# Patient Record
Sex: Female | Born: 1945 | Race: White | Hispanic: No | Marital: Married
Health system: Southern US, Community
[De-identification: ages and names within clinical notes are randomized; demographics above are authoritative.]

---

## 2016-05-18 DIAGNOSIS — F411 Generalized anxiety disorder: Secondary | ICD-10-CM | POA: Diagnosis not present

## 2016-05-18 DIAGNOSIS — F331 Major depressive disorder, recurrent, moderate: Secondary | ICD-10-CM | POA: Diagnosis not present

## 2016-05-26 DIAGNOSIS — F411 Generalized anxiety disorder: Secondary | ICD-10-CM | POA: Diagnosis not present

## 2016-05-26 DIAGNOSIS — F331 Major depressive disorder, recurrent, moderate: Secondary | ICD-10-CM | POA: Diagnosis not present

## 2016-06-30 DIAGNOSIS — F331 Major depressive disorder, recurrent, moderate: Secondary | ICD-10-CM | POA: Diagnosis not present

## 2016-06-30 DIAGNOSIS — F411 Generalized anxiety disorder: Secondary | ICD-10-CM | POA: Diagnosis not present

## 2016-07-03 DIAGNOSIS — R42 Dizziness and giddiness: Secondary | ICD-10-CM | POA: Diagnosis not present

## 2016-07-03 DIAGNOSIS — H8111 Benign paroxysmal vertigo, right ear: Secondary | ICD-10-CM | POA: Diagnosis not present

## 2016-07-06 DIAGNOSIS — R42 Dizziness and giddiness: Secondary | ICD-10-CM | POA: Diagnosis not present

## 2016-07-06 DIAGNOSIS — H8111 Benign paroxysmal vertigo, right ear: Secondary | ICD-10-CM | POA: Diagnosis not present

## 2016-07-07 DIAGNOSIS — F331 Major depressive disorder, recurrent, moderate: Secondary | ICD-10-CM | POA: Diagnosis not present

## 2016-07-07 DIAGNOSIS — F411 Generalized anxiety disorder: Secondary | ICD-10-CM | POA: Diagnosis not present

## 2016-07-07 DIAGNOSIS — H8111 Benign paroxysmal vertigo, right ear: Secondary | ICD-10-CM | POA: Diagnosis not present

## 2016-07-07 DIAGNOSIS — R42 Dizziness and giddiness: Secondary | ICD-10-CM | POA: Diagnosis not present

## 2016-07-09 DIAGNOSIS — H8111 Benign paroxysmal vertigo, right ear: Secondary | ICD-10-CM | POA: Diagnosis not present

## 2016-07-09 DIAGNOSIS — R42 Dizziness and giddiness: Secondary | ICD-10-CM | POA: Diagnosis not present

## 2016-07-09 DIAGNOSIS — F411 Generalized anxiety disorder: Secondary | ICD-10-CM | POA: Diagnosis not present

## 2016-07-09 DIAGNOSIS — F4323 Adjustment disorder with mixed anxiety and depressed mood: Secondary | ICD-10-CM | POA: Diagnosis not present

## 2016-07-14 DIAGNOSIS — H8111 Benign paroxysmal vertigo, right ear: Secondary | ICD-10-CM | POA: Diagnosis not present

## 2016-07-14 DIAGNOSIS — R42 Dizziness and giddiness: Secondary | ICD-10-CM | POA: Diagnosis not present

## 2016-07-17 DIAGNOSIS — H8111 Benign paroxysmal vertigo, right ear: Secondary | ICD-10-CM | POA: Diagnosis not present

## 2016-07-17 DIAGNOSIS — R42 Dizziness and giddiness: Secondary | ICD-10-CM | POA: Diagnosis not present

## 2016-07-20 DIAGNOSIS — H8111 Benign paroxysmal vertigo, right ear: Secondary | ICD-10-CM | POA: Diagnosis not present

## 2016-07-20 DIAGNOSIS — F331 Major depressive disorder, recurrent, moderate: Secondary | ICD-10-CM | POA: Diagnosis not present

## 2016-07-20 DIAGNOSIS — F411 Generalized anxiety disorder: Secondary | ICD-10-CM | POA: Diagnosis not present

## 2016-07-20 DIAGNOSIS — R42 Dizziness and giddiness: Secondary | ICD-10-CM | POA: Diagnosis not present

## 2016-07-22 DIAGNOSIS — H8111 Benign paroxysmal vertigo, right ear: Secondary | ICD-10-CM | POA: Diagnosis not present

## 2016-07-22 DIAGNOSIS — R42 Dizziness and giddiness: Secondary | ICD-10-CM | POA: Diagnosis not present

## 2016-07-30 ENCOUNTER — Ambulatory Visit: Payer: Medicare Other | Admitting: Physical Therapy

## 2016-08-19 ENCOUNTER — Ambulatory Visit: Payer: Self-pay | Admitting: Family Medicine

## 2016-08-19 DIAGNOSIS — F331 Major depressive disorder, recurrent, moderate: Secondary | ICD-10-CM | POA: Diagnosis not present

## 2016-08-19 DIAGNOSIS — F411 Generalized anxiety disorder: Secondary | ICD-10-CM | POA: Diagnosis not present

## 2016-09-17 DIAGNOSIS — F331 Major depressive disorder, recurrent, moderate: Secondary | ICD-10-CM | POA: Diagnosis not present

## 2016-09-17 DIAGNOSIS — F4323 Adjustment disorder with mixed anxiety and depressed mood: Secondary | ICD-10-CM | POA: Diagnosis not present

## 2016-09-17 DIAGNOSIS — F411 Generalized anxiety disorder: Secondary | ICD-10-CM | POA: Diagnosis not present

## 2016-09-17 DIAGNOSIS — F5105 Insomnia due to other mental disorder: Secondary | ICD-10-CM | POA: Diagnosis not present

## 2016-09-23 ENCOUNTER — Ambulatory Visit: Payer: Medicare Other | Admitting: Family Medicine

## 2016-10-20 DIAGNOSIS — F411 Generalized anxiety disorder: Secondary | ICD-10-CM | POA: Diagnosis not present

## 2016-10-20 DIAGNOSIS — F331 Major depressive disorder, recurrent, moderate: Secondary | ICD-10-CM | POA: Diagnosis not present

## 2016-10-27 DIAGNOSIS — F411 Generalized anxiety disorder: Secondary | ICD-10-CM | POA: Diagnosis not present

## 2016-10-27 DIAGNOSIS — F331 Major depressive disorder, recurrent, moderate: Secondary | ICD-10-CM | POA: Diagnosis not present

## 2016-11-09 DIAGNOSIS — R42 Dizziness and giddiness: Secondary | ICD-10-CM | POA: Diagnosis not present

## 2016-11-09 DIAGNOSIS — H8112 Benign paroxysmal vertigo, left ear: Secondary | ICD-10-CM | POA: Diagnosis not present

## 2016-11-10 DIAGNOSIS — F331 Major depressive disorder, recurrent, moderate: Secondary | ICD-10-CM | POA: Diagnosis not present

## 2016-11-10 DIAGNOSIS — F411 Generalized anxiety disorder: Secondary | ICD-10-CM | POA: Diagnosis not present

## 2016-11-13 DIAGNOSIS — H8112 Benign paroxysmal vertigo, left ear: Secondary | ICD-10-CM | POA: Diagnosis not present

## 2016-11-13 DIAGNOSIS — R42 Dizziness and giddiness: Secondary | ICD-10-CM | POA: Diagnosis not present

## 2016-12-10 DIAGNOSIS — F411 Generalized anxiety disorder: Secondary | ICD-10-CM | POA: Diagnosis not present

## 2016-12-10 DIAGNOSIS — F4323 Adjustment disorder with mixed anxiety and depressed mood: Secondary | ICD-10-CM | POA: Diagnosis not present

## 2016-12-11 DIAGNOSIS — F411 Generalized anxiety disorder: Secondary | ICD-10-CM | POA: Diagnosis not present

## 2016-12-11 DIAGNOSIS — H6983 Other specified disorders of Eustachian tube, bilateral: Secondary | ICD-10-CM | POA: Diagnosis not present

## 2016-12-11 DIAGNOSIS — H903 Sensorineural hearing loss, bilateral: Secondary | ICD-10-CM | POA: Diagnosis not present

## 2016-12-11 DIAGNOSIS — F331 Major depressive disorder, recurrent, moderate: Secondary | ICD-10-CM | POA: Diagnosis not present

## 2016-12-11 DIAGNOSIS — H6123 Impacted cerumen, bilateral: Secondary | ICD-10-CM | POA: Diagnosis not present

## 2016-12-11 DIAGNOSIS — J342 Deviated nasal septum: Secondary | ICD-10-CM | POA: Diagnosis not present

## 2017-01-04 DIAGNOSIS — F411 Generalized anxiety disorder: Secondary | ICD-10-CM | POA: Diagnosis not present

## 2017-01-04 DIAGNOSIS — F331 Major depressive disorder, recurrent, moderate: Secondary | ICD-10-CM | POA: Diagnosis not present

## 2017-01-12 DIAGNOSIS — H8111 Benign paroxysmal vertigo, right ear: Secondary | ICD-10-CM | POA: Diagnosis not present

## 2017-01-12 DIAGNOSIS — F331 Major depressive disorder, recurrent, moderate: Secondary | ICD-10-CM | POA: Diagnosis not present

## 2017-01-12 DIAGNOSIS — R42 Dizziness and giddiness: Secondary | ICD-10-CM | POA: Diagnosis not present

## 2017-01-12 DIAGNOSIS — F411 Generalized anxiety disorder: Secondary | ICD-10-CM | POA: Diagnosis not present

## 2017-01-14 DIAGNOSIS — H8111 Benign paroxysmal vertigo, right ear: Secondary | ICD-10-CM | POA: Diagnosis not present

## 2017-01-14 DIAGNOSIS — F419 Anxiety disorder, unspecified: Secondary | ICD-10-CM | POA: Diagnosis not present

## 2017-01-19 DIAGNOSIS — F411 Generalized anxiety disorder: Secondary | ICD-10-CM | POA: Diagnosis not present

## 2017-01-19 DIAGNOSIS — F331 Major depressive disorder, recurrent, moderate: Secondary | ICD-10-CM | POA: Diagnosis not present

## 2017-01-20 DIAGNOSIS — H8111 Benign paroxysmal vertigo, right ear: Secondary | ICD-10-CM | POA: Diagnosis not present

## 2017-01-20 DIAGNOSIS — J329 Chronic sinusitis, unspecified: Secondary | ICD-10-CM | POA: Diagnosis not present

## 2017-02-16 DIAGNOSIS — F331 Major depressive disorder, recurrent, moderate: Secondary | ICD-10-CM | POA: Diagnosis not present

## 2017-02-16 DIAGNOSIS — F411 Generalized anxiety disorder: Secondary | ICD-10-CM | POA: Diagnosis not present

## 2017-02-18 DIAGNOSIS — Z1231 Encounter for screening mammogram for malignant neoplasm of breast: Secondary | ICD-10-CM | POA: Diagnosis not present

## 2017-02-18 DIAGNOSIS — E785 Hyperlipidemia, unspecified: Secondary | ICD-10-CM | POA: Diagnosis not present

## 2017-02-18 DIAGNOSIS — E559 Vitamin D deficiency, unspecified: Secondary | ICD-10-CM | POA: Diagnosis not present

## 2017-02-18 DIAGNOSIS — Z0000 Encounter for general adult medical examination without abnormal findings: Secondary | ICD-10-CM | POA: Diagnosis not present

## 2017-02-18 DIAGNOSIS — Z23 Encounter for immunization: Secondary | ICD-10-CM | POA: Diagnosis not present

## 2017-02-22 DIAGNOSIS — F411 Generalized anxiety disorder: Secondary | ICD-10-CM | POA: Diagnosis not present

## 2017-02-22 DIAGNOSIS — F331 Major depressive disorder, recurrent, moderate: Secondary | ICD-10-CM | POA: Diagnosis not present

## 2017-02-25 DIAGNOSIS — Z0000 Encounter for general adult medical examination without abnormal findings: Secondary | ICD-10-CM | POA: Diagnosis not present

## 2017-02-25 DIAGNOSIS — R350 Frequency of micturition: Secondary | ICD-10-CM | POA: Diagnosis not present

## 2017-02-25 DIAGNOSIS — E559 Vitamin D deficiency, unspecified: Secondary | ICD-10-CM | POA: Diagnosis not present

## 2017-02-25 DIAGNOSIS — E785 Hyperlipidemia, unspecified: Secondary | ICD-10-CM | POA: Diagnosis not present

## 2017-03-24 DIAGNOSIS — F331 Major depressive disorder, recurrent, moderate: Secondary | ICD-10-CM | POA: Diagnosis not present

## 2017-03-24 DIAGNOSIS — F411 Generalized anxiety disorder: Secondary | ICD-10-CM | POA: Diagnosis not present

## 2017-04-26 DIAGNOSIS — F331 Major depressive disorder, recurrent, moderate: Secondary | ICD-10-CM | POA: Diagnosis not present

## 2017-04-26 DIAGNOSIS — F411 Generalized anxiety disorder: Secondary | ICD-10-CM | POA: Diagnosis not present

## 2017-05-13 DIAGNOSIS — T300 Burn of unspecified body region, unspecified degree: Secondary | ICD-10-CM | POA: Diagnosis not present

## 2017-05-13 DIAGNOSIS — J309 Allergic rhinitis, unspecified: Secondary | ICD-10-CM | POA: Diagnosis not present

## 2017-05-13 DIAGNOSIS — L0390 Cellulitis, unspecified: Secondary | ICD-10-CM | POA: Diagnosis not present

## 2017-05-14 DIAGNOSIS — M810 Age-related osteoporosis without current pathological fracture: Secondary | ICD-10-CM | POA: Diagnosis not present

## 2017-05-14 DIAGNOSIS — F411 Generalized anxiety disorder: Secondary | ICD-10-CM | POA: Diagnosis not present

## 2017-05-14 DIAGNOSIS — F331 Major depressive disorder, recurrent, moderate: Secondary | ICD-10-CM | POA: Diagnosis not present

## 2017-05-14 DIAGNOSIS — M818 Other osteoporosis without current pathological fracture: Secondary | ICD-10-CM | POA: Diagnosis not present

## 2017-05-14 DIAGNOSIS — M85852 Other specified disorders of bone density and structure, left thigh: Secondary | ICD-10-CM | POA: Diagnosis not present

## 2017-05-21 DIAGNOSIS — F331 Major depressive disorder, recurrent, moderate: Secondary | ICD-10-CM | POA: Diagnosis not present

## 2017-05-21 DIAGNOSIS — F411 Generalized anxiety disorder: Secondary | ICD-10-CM | POA: Diagnosis not present

## 2017-06-23 DIAGNOSIS — J300 Vasomotor rhinitis: Secondary | ICD-10-CM | POA: Diagnosis not present

## 2017-06-23 DIAGNOSIS — Z01419 Encounter for gynecological examination (general) (routine) without abnormal findings: Secondary | ICD-10-CM | POA: Diagnosis not present

## 2017-06-23 DIAGNOSIS — J301 Allergic rhinitis due to pollen: Secondary | ICD-10-CM | POA: Diagnosis not present

## 2017-06-24 DIAGNOSIS — Z1231 Encounter for screening mammogram for malignant neoplasm of breast: Secondary | ICD-10-CM | POA: Diagnosis not present

## 2017-06-25 DIAGNOSIS — F331 Major depressive disorder, recurrent, moderate: Secondary | ICD-10-CM | POA: Diagnosis not present

## 2017-06-25 DIAGNOSIS — F411 Generalized anxiety disorder: Secondary | ICD-10-CM | POA: Diagnosis not present

## 2017-07-01 DIAGNOSIS — J300 Vasomotor rhinitis: Secondary | ICD-10-CM | POA: Diagnosis not present

## 2017-07-02 DIAGNOSIS — F411 Generalized anxiety disorder: Secondary | ICD-10-CM | POA: Diagnosis not present

## 2017-07-02 DIAGNOSIS — F331 Major depressive disorder, recurrent, moderate: Secondary | ICD-10-CM | POA: Diagnosis not present

## 2017-07-23 DIAGNOSIS — F411 Generalized anxiety disorder: Secondary | ICD-10-CM | POA: Diagnosis not present

## 2017-07-23 DIAGNOSIS — F331 Major depressive disorder, recurrent, moderate: Secondary | ICD-10-CM | POA: Diagnosis not present

## 2017-07-26 DIAGNOSIS — Z87891 Personal history of nicotine dependence: Secondary | ICD-10-CM | POA: Diagnosis not present

## 2017-07-26 DIAGNOSIS — J322 Chronic ethmoidal sinusitis: Secondary | ICD-10-CM | POA: Diagnosis not present

## 2017-07-26 DIAGNOSIS — F418 Other specified anxiety disorders: Secondary | ICD-10-CM | POA: Diagnosis not present

## 2017-07-26 DIAGNOSIS — H6983 Other specified disorders of Eustachian tube, bilateral: Secondary | ICD-10-CM | POA: Diagnosis not present

## 2017-07-30 DIAGNOSIS — F411 Generalized anxiety disorder: Secondary | ICD-10-CM | POA: Diagnosis not present

## 2017-07-30 DIAGNOSIS — F331 Major depressive disorder, recurrent, moderate: Secondary | ICD-10-CM | POA: Diagnosis not present

## 2017-08-04 DIAGNOSIS — F411 Generalized anxiety disorder: Secondary | ICD-10-CM | POA: Diagnosis not present

## 2017-08-04 DIAGNOSIS — F331 Major depressive disorder, recurrent, moderate: Secondary | ICD-10-CM | POA: Diagnosis not present

## 2017-08-27 DIAGNOSIS — J45909 Unspecified asthma, uncomplicated: Secondary | ICD-10-CM | POA: Diagnosis not present

## 2017-08-30 DIAGNOSIS — F411 Generalized anxiety disorder: Secondary | ICD-10-CM | POA: Diagnosis not present

## 2017-08-30 DIAGNOSIS — F331 Major depressive disorder, recurrent, moderate: Secondary | ICD-10-CM | POA: Diagnosis not present

## 2017-09-01 DIAGNOSIS — J323 Chronic sphenoidal sinusitis: Secondary | ICD-10-CM | POA: Diagnosis not present

## 2017-09-01 DIAGNOSIS — J322 Chronic ethmoidal sinusitis: Secondary | ICD-10-CM | POA: Diagnosis not present

## 2017-09-01 DIAGNOSIS — J341 Cyst and mucocele of nose and nasal sinus: Secondary | ICD-10-CM | POA: Diagnosis not present

## 2017-09-06 DIAGNOSIS — Z87891 Personal history of nicotine dependence: Secondary | ICD-10-CM | POA: Diagnosis not present

## 2017-09-06 DIAGNOSIS — J322 Chronic ethmoidal sinusitis: Secondary | ICD-10-CM | POA: Diagnosis not present

## 2017-09-06 DIAGNOSIS — F411 Generalized anxiety disorder: Secondary | ICD-10-CM | POA: Diagnosis not present

## 2017-09-06 DIAGNOSIS — F331 Major depressive disorder, recurrent, moderate: Secondary | ICD-10-CM | POA: Diagnosis not present

## 2017-09-13 DIAGNOSIS — F331 Major depressive disorder, recurrent, moderate: Secondary | ICD-10-CM | POA: Diagnosis not present

## 2017-09-13 DIAGNOSIS — F411 Generalized anxiety disorder: Secondary | ICD-10-CM | POA: Diagnosis not present

## 2017-10-25 DIAGNOSIS — J339 Nasal polyp, unspecified: Secondary | ICD-10-CM | POA: Diagnosis not present

## 2017-10-25 DIAGNOSIS — J322 Chronic ethmoidal sinusitis: Secondary | ICD-10-CM | POA: Diagnosis not present

## 2017-10-25 DIAGNOSIS — J343 Hypertrophy of nasal turbinates: Secondary | ICD-10-CM | POA: Diagnosis not present

## 2017-10-25 DIAGNOSIS — R94128 Abnormal results of other function studies of ear and other special senses: Secondary | ICD-10-CM | POA: Diagnosis not present

## 2017-12-21 DIAGNOSIS — J343 Hypertrophy of nasal turbinates: Secondary | ICD-10-CM | POA: Diagnosis not present

## 2017-12-21 DIAGNOSIS — J320 Chronic maxillary sinusitis: Secondary | ICD-10-CM | POA: Diagnosis not present

## 2017-12-21 DIAGNOSIS — J339 Nasal polyp, unspecified: Secondary | ICD-10-CM | POA: Diagnosis not present

## 2017-12-21 DIAGNOSIS — R9389 Abnormal findings on diagnostic imaging of other specified body structures: Secondary | ICD-10-CM | POA: Diagnosis not present

## 2017-12-21 DIAGNOSIS — R0981 Nasal congestion: Secondary | ICD-10-CM | POA: Diagnosis not present

## 2017-12-21 DIAGNOSIS — J3489 Other specified disorders of nose and nasal sinuses: Secondary | ICD-10-CM | POA: Diagnosis not present

## 2017-12-23 DIAGNOSIS — J310 Chronic rhinitis: Secondary | ICD-10-CM | POA: Diagnosis not present

## 2018-02-22 DIAGNOSIS — L600 Ingrowing nail: Secondary | ICD-10-CM | POA: Diagnosis not present

## 2018-02-22 DIAGNOSIS — M810 Age-related osteoporosis without current pathological fracture: Secondary | ICD-10-CM | POA: Diagnosis not present

## 2018-02-22 DIAGNOSIS — J329 Chronic sinusitis, unspecified: Secondary | ICD-10-CM | POA: Diagnosis not present

## 2018-02-22 DIAGNOSIS — Z1389 Encounter for screening for other disorder: Secondary | ICD-10-CM | POA: Diagnosis not present

## 2018-02-22 DIAGNOSIS — E559 Vitamin D deficiency, unspecified: Secondary | ICD-10-CM | POA: Diagnosis not present

## 2018-02-22 DIAGNOSIS — H8110 Benign paroxysmal vertigo, unspecified ear: Secondary | ICD-10-CM | POA: Diagnosis not present

## 2018-02-22 DIAGNOSIS — D2230 Melanocytic nevi of unspecified part of face: Secondary | ICD-10-CM | POA: Diagnosis not present

## 2018-02-22 DIAGNOSIS — Z23 Encounter for immunization: Secondary | ICD-10-CM | POA: Diagnosis not present

## 2018-02-22 DIAGNOSIS — Z0000 Encounter for general adult medical examination without abnormal findings: Secondary | ICD-10-CM | POA: Diagnosis not present

## 2018-02-22 DIAGNOSIS — Z136 Encounter for screening for cardiovascular disorders: Secondary | ICD-10-CM | POA: Diagnosis not present

## 2018-04-14 DIAGNOSIS — J310 Chronic rhinitis: Secondary | ICD-10-CM | POA: Diagnosis not present

## 2018-04-14 DIAGNOSIS — J339 Nasal polyp, unspecified: Secondary | ICD-10-CM | POA: Diagnosis not present

## 2018-04-18 DIAGNOSIS — L821 Other seborrheic keratosis: Secondary | ICD-10-CM | POA: Diagnosis not present

## 2018-04-18 DIAGNOSIS — Z23 Encounter for immunization: Secondary | ICD-10-CM | POA: Diagnosis not present

## 2018-05-20 ENCOUNTER — Other Ambulatory Visit: Payer: Self-pay | Admitting: Family Medicine

## 2018-05-20 DIAGNOSIS — Z1231 Encounter for screening mammogram for malignant neoplasm of breast: Secondary | ICD-10-CM

## 2018-06-27 ENCOUNTER — Ambulatory Visit: Payer: Medicare Other

## 2018-06-27 DIAGNOSIS — Z1231 Encounter for screening mammogram for malignant neoplasm of breast: Secondary | ICD-10-CM | POA: Diagnosis not present

## 2019-01-17 DIAGNOSIS — N952 Postmenopausal atrophic vaginitis: Secondary | ICD-10-CM | POA: Diagnosis not present

## 2019-01-17 DIAGNOSIS — Z23 Encounter for immunization: Secondary | ICD-10-CM | POA: Diagnosis not present

## 2019-01-17 DIAGNOSIS — L989 Disorder of the skin and subcutaneous tissue, unspecified: Secondary | ICD-10-CM | POA: Diagnosis not present

## 2019-01-23 DIAGNOSIS — Z01419 Encounter for gynecological examination (general) (routine) without abnormal findings: Secondary | ICD-10-CM | POA: Diagnosis not present

## 2019-01-23 DIAGNOSIS — L918 Other hypertrophic disorders of the skin: Secondary | ICD-10-CM | POA: Diagnosis not present

## 2019-01-23 DIAGNOSIS — N898 Other specified noninflammatory disorders of vagina: Secondary | ICD-10-CM | POA: Diagnosis not present

## 2019-01-23 DIAGNOSIS — N952 Postmenopausal atrophic vaginitis: Secondary | ICD-10-CM | POA: Diagnosis not present

## 2019-01-30 DIAGNOSIS — J310 Chronic rhinitis: Secondary | ICD-10-CM | POA: Diagnosis not present

## 2019-01-30 DIAGNOSIS — J329 Chronic sinusitis, unspecified: Secondary | ICD-10-CM | POA: Diagnosis not present

## 2019-02-01 DIAGNOSIS — D229 Melanocytic nevi, unspecified: Secondary | ICD-10-CM | POA: Diagnosis not present

## 2019-02-01 DIAGNOSIS — D492 Neoplasm of unspecified behavior of bone, soft tissue, and skin: Secondary | ICD-10-CM | POA: Diagnosis not present

## 2019-02-01 DIAGNOSIS — D287 Benign neoplasm of other specified female genital organs: Secondary | ICD-10-CM | POA: Diagnosis not present

## 2019-03-07 DIAGNOSIS — Z1389 Encounter for screening for other disorder: Secondary | ICD-10-CM | POA: Diagnosis not present

## 2019-03-07 DIAGNOSIS — M810 Age-related osteoporosis without current pathological fracture: Secondary | ICD-10-CM | POA: Diagnosis not present

## 2019-03-07 DIAGNOSIS — E559 Vitamin D deficiency, unspecified: Secondary | ICD-10-CM | POA: Diagnosis not present

## 2019-03-07 DIAGNOSIS — Z0000 Encounter for general adult medical examination without abnormal findings: Secondary | ICD-10-CM | POA: Diagnosis not present

## 2019-03-07 DIAGNOSIS — J309 Allergic rhinitis, unspecified: Secondary | ICD-10-CM | POA: Diagnosis not present

## 2019-03-07 DIAGNOSIS — Z131 Encounter for screening for diabetes mellitus: Secondary | ICD-10-CM | POA: Diagnosis not present

## 2019-03-27 DIAGNOSIS — Z23 Encounter for immunization: Secondary | ICD-10-CM | POA: Diagnosis not present

## 2019-03-27 DIAGNOSIS — Z131 Encounter for screening for diabetes mellitus: Secondary | ICD-10-CM | POA: Diagnosis not present

## 2019-03-27 DIAGNOSIS — E559 Vitamin D deficiency, unspecified: Secondary | ICD-10-CM | POA: Diagnosis not present

## 2019-06-18 ENCOUNTER — Ambulatory Visit: Payer: Medicare Other | Attending: Internal Medicine

## 2019-06-18 DIAGNOSIS — Z23 Encounter for immunization: Secondary | ICD-10-CM | POA: Insufficient documentation

## 2019-06-18 NOTE — Progress Notes (Signed)
   Covid-19 Vaccination Clinic  Name:  Ineke Stufflebean    MRN: [id number] DOB: Jan 01, 1946  06/18/2019  Ms. Bothun was observed post Covid-19 immunization for 15 minutes without incidence. She was provided with Vaccine Information Sheet and instruction to access the V-Safe system.   Ms. Uher was instructed to call 911 with any severe reactions post vaccine: Marland Kitchen Difficulty breathing  . Swelling of your face and throat  . A fast heartbeat  . A bad rash all over your body  . Dizziness and weakness    Immunizations Administered    Name Date Dose VIS Date Route   Pfizer COVID-19 Vaccine 06/18/2019  5:24 PM 0.3 mL 04/21/2019 Intramuscular   Manufacturer: [place]   Lot: [id number]   [place]: [id number]

## 2019-07-07 ENCOUNTER — Ambulatory Visit: Payer: Medicare Other

## 2019-07-13 ENCOUNTER — Ambulatory Visit: Payer: Medicare Other | Attending: Internal Medicine

## 2019-07-13 DIAGNOSIS — Z23 Encounter for immunization: Secondary | ICD-10-CM | POA: Insufficient documentation

## 2019-07-13 NOTE — Progress Notes (Signed)
   Covid-19 Vaccination Clinic  Name:  Tina Vega    MRN: [id number] DOB: [birth year]  07/13/2019  Tina Vega was observed post Covid-19 immunization for 15 minutes without incident. She was provided with Vaccine Information Sheet and instruction to access the V-Safe system.   Tina Vega was instructed to call 911 with any severe reactions post vaccine: Marland Kitchen Difficulty breathing  . Swelling of face and throat  . A fast heartbeat  . A bad rash all over body  . Dizziness and weakness   Immunizations Administered    Name Date Dose VIS Date Route   Pfizer COVID-19 Vaccine 07/13/2019  2:35 PM 0.3 mL 04/21/2019 Intramuscular   Manufacturer: [place]   Lot: [id number]   [place]: [id number]

## 2019-07-13 NOTE — Progress Notes (Deleted)
   Covid-19 Vaccination Clinic  Name:  Tina Vega    MRN: [id number] DOB: 01-26-1946  07/13/2019  [name] was observed post Covid-19 immunization for 15 minutes without incident. She was provided with Vaccine Information Sheet and instruction to access the V-Safe system.   [name] was instructed to call 911 with any severe reactions post vaccine: Marland Kitchen Difficulty breathing  . Swelling of face and throat  . A fast heartbeat  . A bad rash all over body  . Dizziness and weakness   Immunizations Administered    Name Date Dose VIS Date Route   Pfizer COVID-19 Vaccine 07/13/2019  2:35 PM 0.3 mL 04/21/2019 Intramuscular   Manufacturer: [place]   Lot: [id number]   [place]: [id number]

## 2019-07-17 DIAGNOSIS — N94819 Vulvodynia, unspecified: Secondary | ICD-10-CM | POA: Diagnosis not present

## 2019-07-19 DIAGNOSIS — L0231 Cutaneous abscess of buttock: Secondary | ICD-10-CM | POA: Diagnosis not present

## 2019-08-09 DIAGNOSIS — H52203 Unspecified astigmatism, bilateral: Secondary | ICD-10-CM | POA: Diagnosis not present

## 2019-08-09 DIAGNOSIS — H2513 Age-related nuclear cataract, bilateral: Secondary | ICD-10-CM | POA: Diagnosis not present

## 2019-08-24 DIAGNOSIS — J310 Chronic rhinitis: Secondary | ICD-10-CM | POA: Diagnosis not present

## 2019-08-24 DIAGNOSIS — J343 Hypertrophy of nasal turbinates: Secondary | ICD-10-CM | POA: Diagnosis not present

## 2019-08-24 DIAGNOSIS — J339 Nasal polyp, unspecified: Secondary | ICD-10-CM | POA: Diagnosis not present

## 2019-08-24 DIAGNOSIS — R94128 Abnormal results of other function studies of ear and other special senses: Secondary | ICD-10-CM | POA: Diagnosis not present

## 2019-08-24 DIAGNOSIS — J322 Chronic ethmoidal sinusitis: Secondary | ICD-10-CM | POA: Diagnosis not present

## 2019-08-28 DIAGNOSIS — Z1231 Encounter for screening mammogram for malignant neoplasm of breast: Secondary | ICD-10-CM | POA: Diagnosis not present

## 2019-08-28 DIAGNOSIS — N94819 Vulvodynia, unspecified: Secondary | ICD-10-CM | POA: Diagnosis not present

## 2019-10-23 DIAGNOSIS — D239 Other benign neoplasm of skin, unspecified: Secondary | ICD-10-CM | POA: Diagnosis not present

## 2019-10-23 DIAGNOSIS — L821 Other seborrheic keratosis: Secondary | ICD-10-CM | POA: Diagnosis not present

## 2020-01-03 DIAGNOSIS — D485 Neoplasm of uncertain behavior of skin: Secondary | ICD-10-CM | POA: Diagnosis not present

## 2020-01-03 DIAGNOSIS — D224 Melanocytic nevi of scalp and neck: Secondary | ICD-10-CM | POA: Diagnosis not present

## 2020-01-23 DIAGNOSIS — M810 Age-related osteoporosis without current pathological fracture: Secondary | ICD-10-CM | POA: Diagnosis not present

## 2020-01-23 DIAGNOSIS — Z01419 Encounter for gynecological examination (general) (routine) without abnormal findings: Secondary | ICD-10-CM | POA: Diagnosis not present

## 2020-01-23 DIAGNOSIS — N94819 Vulvodynia, unspecified: Secondary | ICD-10-CM | POA: Diagnosis not present

## 2020-01-25 ENCOUNTER — Other Ambulatory Visit: Payer: Self-pay | Admitting: Nurse Practitioner

## 2020-01-25 DIAGNOSIS — M810 Age-related osteoporosis without current pathological fracture: Secondary | ICD-10-CM

## 2020-01-30 ENCOUNTER — Ambulatory Visit
Admission: RE | Admit: 2020-01-30 | Discharge: 2020-01-30 | Disposition: A | Discharge status: Home / Self Care | Payer: Medicare Other | Source: Ambulatory Visit | Attending: Nurse Practitioner | Admitting: Nurse Practitioner

## 2020-01-30 ENCOUNTER — Other Ambulatory Visit: Payer: Self-pay

## 2020-01-30 DIAGNOSIS — M8589 Other specified disorders of bone density and structure, multiple sites: Secondary | ICD-10-CM | POA: Diagnosis not present

## 2020-01-30 DIAGNOSIS — Z780 Asymptomatic menopausal state: Secondary | ICD-10-CM | POA: Diagnosis not present

## 2020-01-30 DIAGNOSIS — M810 Age-related osteoporosis without current pathological fracture: Secondary | ICD-10-CM

## 2020-02-08 DIAGNOSIS — R030 Elevated blood-pressure reading, without diagnosis of hypertension: Secondary | ICD-10-CM | POA: Diagnosis not present

## 2020-02-08 DIAGNOSIS — B029 Zoster without complications: Secondary | ICD-10-CM | POA: Diagnosis not present

## 2020-03-08 DIAGNOSIS — M810 Age-related osteoporosis without current pathological fracture: Secondary | ICD-10-CM | POA: Diagnosis not present

## 2020-03-08 DIAGNOSIS — J309 Allergic rhinitis, unspecified: Secondary | ICD-10-CM | POA: Diagnosis not present

## 2020-03-08 DIAGNOSIS — Z0000 Encounter for general adult medical examination without abnormal findings: Secondary | ICD-10-CM | POA: Diagnosis not present

## 2020-03-08 DIAGNOSIS — Z23 Encounter for immunization: Secondary | ICD-10-CM | POA: Diagnosis not present

## 2020-03-08 DIAGNOSIS — E559 Vitamin D deficiency, unspecified: Secondary | ICD-10-CM | POA: Diagnosis not present

## 2020-03-08 DIAGNOSIS — Z1389 Encounter for screening for other disorder: Secondary | ICD-10-CM | POA: Diagnosis not present

## 2020-03-08 DIAGNOSIS — Z131 Encounter for screening for diabetes mellitus: Secondary | ICD-10-CM | POA: Diagnosis not present

## 2020-03-08 DIAGNOSIS — Z1211 Encounter for screening for malignant neoplasm of colon: Secondary | ICD-10-CM | POA: Diagnosis not present

## 2020-03-18 DIAGNOSIS — Z23 Encounter for immunization: Secondary | ICD-10-CM | POA: Diagnosis not present

## 2020-04-22 ENCOUNTER — Other Ambulatory Visit: Payer: Medicare Other

## 2020-07-10 DIAGNOSIS — K921 Melena: Secondary | ICD-10-CM | POA: Diagnosis not present

## 2020-07-10 DIAGNOSIS — K5901 Slow transit constipation: Secondary | ICD-10-CM | POA: Diagnosis not present

## 2020-07-10 DIAGNOSIS — Z1211 Encounter for screening for malignant neoplasm of colon: Secondary | ICD-10-CM | POA: Diagnosis not present

## 2020-07-18 DIAGNOSIS — S39012D Strain of muscle, fascia and tendon of lower back, subsequent encounter: Secondary | ICD-10-CM | POA: Diagnosis not present

## 2020-07-18 DIAGNOSIS — M6281 Muscle weakness (generalized): Secondary | ICD-10-CM | POA: Diagnosis not present

## 2020-07-30 DIAGNOSIS — S39012D Strain of muscle, fascia and tendon of lower back, subsequent encounter: Secondary | ICD-10-CM | POA: Diagnosis not present

## 2020-07-30 DIAGNOSIS — M6281 Muscle weakness (generalized): Secondary | ICD-10-CM | POA: Diagnosis not present

## 2020-07-31 DIAGNOSIS — S39012D Strain of muscle, fascia and tendon of lower back, subsequent encounter: Secondary | ICD-10-CM | POA: Diagnosis not present

## 2020-07-31 DIAGNOSIS — M6281 Muscle weakness (generalized): Secondary | ICD-10-CM | POA: Diagnosis not present

## 2020-08-21 DIAGNOSIS — H2513 Age-related nuclear cataract, bilateral: Secondary | ICD-10-CM | POA: Diagnosis not present

## 2020-08-21 DIAGNOSIS — H52203 Unspecified astigmatism, bilateral: Secondary | ICD-10-CM | POA: Diagnosis not present

## 2020-08-21 DIAGNOSIS — H524 Presbyopia: Secondary | ICD-10-CM | POA: Diagnosis not present

## 2020-08-24 DIAGNOSIS — Z23 Encounter for immunization: Secondary | ICD-10-CM | POA: Diagnosis not present

## 2020-09-09 DIAGNOSIS — Z1231 Encounter for screening mammogram for malignant neoplasm of breast: Secondary | ICD-10-CM | POA: Diagnosis not present

## 2020-09-20 DIAGNOSIS — W57XXXA Bitten or stung by nonvenomous insect and other nonvenomous arthropods, initial encounter: Secondary | ICD-10-CM | POA: Diagnosis not present

## 2020-09-20 DIAGNOSIS — S1096XA Insect bite of unspecified part of neck, initial encounter: Secondary | ICD-10-CM | POA: Diagnosis not present

## 2020-10-05 DIAGNOSIS — Z20822 Contact with and (suspected) exposure to covid-19: Secondary | ICD-10-CM | POA: Diagnosis not present

## 2020-10-24 DIAGNOSIS — Z20822 Contact with and (suspected) exposure to covid-19: Secondary | ICD-10-CM | POA: Diagnosis not present

## 2020-11-29 DIAGNOSIS — Z20822 Contact with and (suspected) exposure to covid-19: Secondary | ICD-10-CM | POA: Diagnosis not present

## 2020-12-31 DIAGNOSIS — Z20822 Contact with and (suspected) exposure to covid-19: Secondary | ICD-10-CM | POA: Diagnosis not present

## 2021-01-23 DIAGNOSIS — N94819 Vulvodynia, unspecified: Secondary | ICD-10-CM | POA: Diagnosis not present

## 2021-01-28 DIAGNOSIS — Z23 Encounter for immunization: Secondary | ICD-10-CM | POA: Diagnosis not present

## 2021-01-31 DIAGNOSIS — Z20822 Contact with and (suspected) exposure to covid-19: Secondary | ICD-10-CM | POA: Diagnosis not present

## 2021-02-19 DIAGNOSIS — Z23 Encounter for immunization: Secondary | ICD-10-CM | POA: Diagnosis not present

## 2021-03-03 DIAGNOSIS — Z20822 Contact with and (suspected) exposure to covid-19: Secondary | ICD-10-CM | POA: Diagnosis not present

## 2021-03-19 DIAGNOSIS — E559 Vitamin D deficiency, unspecified: Secondary | ICD-10-CM | POA: Diagnosis not present

## 2021-03-19 DIAGNOSIS — Z1389 Encounter for screening for other disorder: Secondary | ICD-10-CM | POA: Diagnosis not present

## 2021-03-19 DIAGNOSIS — M542 Cervicalgia: Secondary | ICD-10-CM | POA: Diagnosis not present

## 2021-03-19 DIAGNOSIS — M810 Age-related osteoporosis without current pathological fracture: Secondary | ICD-10-CM | POA: Diagnosis not present

## 2021-03-19 DIAGNOSIS — Z131 Encounter for screening for diabetes mellitus: Secondary | ICD-10-CM | POA: Diagnosis not present

## 2021-03-19 DIAGNOSIS — Z0000 Encounter for general adult medical examination without abnormal findings: Secondary | ICD-10-CM | POA: Diagnosis not present

## 2021-03-19 DIAGNOSIS — J309 Allergic rhinitis, unspecified: Secondary | ICD-10-CM | POA: Diagnosis not present

## 2021-04-01 DIAGNOSIS — S161XXD Strain of muscle, fascia and tendon at neck level, subsequent encounter: Secondary | ICD-10-CM | POA: Diagnosis not present

## 2021-04-07 DIAGNOSIS — Z20822 Contact with and (suspected) exposure to covid-19: Secondary | ICD-10-CM | POA: Diagnosis not present

## 2021-04-08 DIAGNOSIS — S161XXD Strain of muscle, fascia and tendon at neck level, subsequent encounter: Secondary | ICD-10-CM | POA: Diagnosis not present

## 2021-04-11 DIAGNOSIS — S161XXD Strain of muscle, fascia and tendon at neck level, subsequent encounter: Secondary | ICD-10-CM | POA: Diagnosis not present

## 2021-04-14 DIAGNOSIS — S161XXD Strain of muscle, fascia and tendon at neck level, subsequent encounter: Secondary | ICD-10-CM | POA: Diagnosis not present

## 2021-04-17 DIAGNOSIS — S161XXD Strain of muscle, fascia and tendon at neck level, subsequent encounter: Secondary | ICD-10-CM | POA: Diagnosis not present

## 2021-04-18 DIAGNOSIS — L304 Erythema intertrigo: Secondary | ICD-10-CM | POA: Diagnosis not present

## 2021-04-21 DIAGNOSIS — S161XXD Strain of muscle, fascia and tendon at neck level, subsequent encounter: Secondary | ICD-10-CM | POA: Diagnosis not present

## 2021-05-09 DIAGNOSIS — Z23 Encounter for immunization: Secondary | ICD-10-CM | POA: Diagnosis not present

## 2021-05-09 DIAGNOSIS — F419 Anxiety disorder, unspecified: Secondary | ICD-10-CM | POA: Diagnosis not present

## 2021-05-09 DIAGNOSIS — N94819 Vulvodynia, unspecified: Secondary | ICD-10-CM | POA: Diagnosis not present

## 2021-05-13 DIAGNOSIS — S161XXD Strain of muscle, fascia and tendon at neck level, subsequent encounter: Secondary | ICD-10-CM | POA: Diagnosis not present

## 2021-05-13 DIAGNOSIS — N94819 Vulvodynia, unspecified: Secondary | ICD-10-CM | POA: Diagnosis not present

## 2021-05-13 DIAGNOSIS — N952 Postmenopausal atrophic vaginitis: Secondary | ICD-10-CM | POA: Diagnosis not present

## 2021-05-16 DIAGNOSIS — S161XXD Strain of muscle, fascia and tendon at neck level, subsequent encounter: Secondary | ICD-10-CM | POA: Diagnosis not present

## 2021-05-20 DIAGNOSIS — S161XXD Strain of muscle, fascia and tendon at neck level, subsequent encounter: Secondary | ICD-10-CM | POA: Diagnosis not present

## 2021-05-23 DIAGNOSIS — S161XXD Strain of muscle, fascia and tendon at neck level, subsequent encounter: Secondary | ICD-10-CM | POA: Diagnosis not present

## 2021-05-27 DIAGNOSIS — L739 Follicular disorder, unspecified: Secondary | ICD-10-CM | POA: Diagnosis not present

## 2021-05-27 DIAGNOSIS — L821 Other seborrheic keratosis: Secondary | ICD-10-CM | POA: Diagnosis not present

## 2021-05-27 DIAGNOSIS — Z23 Encounter for immunization: Secondary | ICD-10-CM | POA: Diagnosis not present

## 2021-05-29 DIAGNOSIS — S161XXD Strain of muscle, fascia and tendon at neck level, subsequent encounter: Secondary | ICD-10-CM | POA: Diagnosis not present

## 2021-06-03 DIAGNOSIS — S161XXD Strain of muscle, fascia and tendon at neck level, subsequent encounter: Secondary | ICD-10-CM | POA: Diagnosis not present

## 2021-06-06 DIAGNOSIS — S161XXD Strain of muscle, fascia and tendon at neck level, subsequent encounter: Secondary | ICD-10-CM | POA: Diagnosis not present

## 2021-06-09 DIAGNOSIS — S161XXD Strain of muscle, fascia and tendon at neck level, subsequent encounter: Secondary | ICD-10-CM | POA: Diagnosis not present

## 2021-06-13 DIAGNOSIS — S161XXD Strain of muscle, fascia and tendon at neck level, subsequent encounter: Secondary | ICD-10-CM | POA: Diagnosis not present

## 2021-06-17 DIAGNOSIS — S161XXD Strain of muscle, fascia and tendon at neck level, subsequent encounter: Secondary | ICD-10-CM | POA: Diagnosis not present

## 2021-06-20 DIAGNOSIS — S161XXD Strain of muscle, fascia and tendon at neck level, subsequent encounter: Secondary | ICD-10-CM | POA: Diagnosis not present

## 2021-06-23 DIAGNOSIS — N952 Postmenopausal atrophic vaginitis: Secondary | ICD-10-CM | POA: Diagnosis not present

## 2021-06-23 DIAGNOSIS — S161XXD Strain of muscle, fascia and tendon at neck level, subsequent encounter: Secondary | ICD-10-CM | POA: Diagnosis not present

## 2021-06-27 DIAGNOSIS — S161XXD Strain of muscle, fascia and tendon at neck level, subsequent encounter: Secondary | ICD-10-CM | POA: Diagnosis not present

## 2021-07-03 DIAGNOSIS — S161XXD Strain of muscle, fascia and tendon at neck level, subsequent encounter: Secondary | ICD-10-CM | POA: Diagnosis not present

## 2021-07-09 DIAGNOSIS — S161XXD Strain of muscle, fascia and tendon at neck level, subsequent encounter: Secondary | ICD-10-CM | POA: Diagnosis not present

## 2021-07-11 DIAGNOSIS — S161XXD Strain of muscle, fascia and tendon at neck level, subsequent encounter: Secondary | ICD-10-CM | POA: Diagnosis not present

## 2021-07-16 DIAGNOSIS — S161XXD Strain of muscle, fascia and tendon at neck level, subsequent encounter: Secondary | ICD-10-CM | POA: Diagnosis not present

## 2021-07-23 DIAGNOSIS — S161XXD Strain of muscle, fascia and tendon at neck level, subsequent encounter: Secondary | ICD-10-CM | POA: Diagnosis not present

## 2021-07-30 DIAGNOSIS — S161XXD Strain of muscle, fascia and tendon at neck level, subsequent encounter: Secondary | ICD-10-CM | POA: Diagnosis not present

## 2021-08-06 DIAGNOSIS — S161XXD Strain of muscle, fascia and tendon at neck level, subsequent encounter: Secondary | ICD-10-CM | POA: Diagnosis not present

## 2021-08-08 DIAGNOSIS — Z791 Long term (current) use of non-steroidal anti-inflammatories (NSAID): Secondary | ICD-10-CM | POA: Diagnosis not present

## 2021-08-08 DIAGNOSIS — M542 Cervicalgia: Secondary | ICD-10-CM | POA: Diagnosis not present

## 2021-08-08 DIAGNOSIS — J309 Allergic rhinitis, unspecified: Secondary | ICD-10-CM | POA: Diagnosis not present

## 2021-08-08 DIAGNOSIS — G44221 Chronic tension-type headache, intractable: Secondary | ICD-10-CM | POA: Diagnosis not present

## 2021-08-13 DIAGNOSIS — S161XXD Strain of muscle, fascia and tendon at neck level, subsequent encounter: Secondary | ICD-10-CM | POA: Diagnosis not present

## 2021-08-20 DIAGNOSIS — J309 Allergic rhinitis, unspecified: Secondary | ICD-10-CM | POA: Diagnosis not present

## 2021-08-20 DIAGNOSIS — M542 Cervicalgia: Secondary | ICD-10-CM | POA: Diagnosis not present

## 2021-08-20 DIAGNOSIS — F419 Anxiety disorder, unspecified: Secondary | ICD-10-CM | POA: Diagnosis not present

## 2021-08-25 DIAGNOSIS — Z20822 Contact with and (suspected) exposure to covid-19: Secondary | ICD-10-CM | POA: Diagnosis not present

## 2021-08-26 DIAGNOSIS — H52203 Unspecified astigmatism, bilateral: Secondary | ICD-10-CM | POA: Diagnosis not present

## 2021-08-26 DIAGNOSIS — H2513 Age-related nuclear cataract, bilateral: Secondary | ICD-10-CM | POA: Diagnosis not present

## 2021-08-27 DIAGNOSIS — M542 Cervicalgia: Secondary | ICD-10-CM | POA: Diagnosis not present

## 2021-09-05 DIAGNOSIS — L821 Other seborrheic keratosis: Secondary | ICD-10-CM | POA: Diagnosis not present

## 2021-09-05 DIAGNOSIS — D2272 Melanocytic nevi of left lower limb, including hip: Secondary | ICD-10-CM | POA: Diagnosis not present

## 2021-09-05 DIAGNOSIS — D1801 Hemangioma of skin and subcutaneous tissue: Secondary | ICD-10-CM | POA: Diagnosis not present

## 2021-09-05 DIAGNOSIS — L573 Poikiloderma of Civatte: Secondary | ICD-10-CM | POA: Diagnosis not present

## 2021-09-05 DIAGNOSIS — L718 Other rosacea: Secondary | ICD-10-CM | POA: Diagnosis not present

## 2021-09-05 DIAGNOSIS — L720 Epidermal cyst: Secondary | ICD-10-CM | POA: Diagnosis not present

## 2021-09-08 DIAGNOSIS — M542 Cervicalgia: Secondary | ICD-10-CM | POA: Diagnosis not present

## 2021-09-15 DIAGNOSIS — M542 Cervicalgia: Secondary | ICD-10-CM | POA: Diagnosis not present

## 2021-09-22 DIAGNOSIS — Z1231 Encounter for screening mammogram for malignant neoplasm of breast: Secondary | ICD-10-CM | POA: Diagnosis not present

## 2021-10-07 DIAGNOSIS — R102 Pelvic and perineal pain: Secondary | ICD-10-CM | POA: Diagnosis not present

## 2021-10-07 DIAGNOSIS — N952 Postmenopausal atrophic vaginitis: Secondary | ICD-10-CM | POA: Diagnosis not present

## 2021-10-13 DIAGNOSIS — M542 Cervicalgia: Secondary | ICD-10-CM | POA: Diagnosis not present

## 2022-01-22 ENCOUNTER — Ambulatory Visit: Payer: Medicare Other | Admitting: Neurology

## 2022-01-22 DIAGNOSIS — L738 Other specified follicular disorders: Secondary | ICD-10-CM | POA: Diagnosis not present

## 2022-02-05 DIAGNOSIS — M542 Cervicalgia: Secondary | ICD-10-CM | POA: Diagnosis not present

## 2022-02-06 DIAGNOSIS — Z23 Encounter for immunization: Secondary | ICD-10-CM | POA: Diagnosis not present

## 2022-02-09 DIAGNOSIS — M542 Cervicalgia: Secondary | ICD-10-CM | POA: Diagnosis not present

## 2022-02-12 DIAGNOSIS — M542 Cervicalgia: Secondary | ICD-10-CM | POA: Diagnosis not present

## 2022-02-17 DIAGNOSIS — M542 Cervicalgia: Secondary | ICD-10-CM | POA: Diagnosis not present

## 2022-02-23 DIAGNOSIS — Z23 Encounter for immunization: Secondary | ICD-10-CM | POA: Diagnosis not present

## 2022-03-18 DIAGNOSIS — H10501 Unspecified blepharoconjunctivitis, right eye: Secondary | ICD-10-CM | POA: Diagnosis not present

## 2022-03-23 DIAGNOSIS — Z131 Encounter for screening for diabetes mellitus: Secondary | ICD-10-CM | POA: Diagnosis not present

## 2022-03-23 DIAGNOSIS — J309 Allergic rhinitis, unspecified: Secondary | ICD-10-CM | POA: Diagnosis not present

## 2022-03-23 DIAGNOSIS — E559 Vitamin D deficiency, unspecified: Secondary | ICD-10-CM | POA: Diagnosis not present

## 2022-03-23 DIAGNOSIS — Z1331 Encounter for screening for depression: Secondary | ICD-10-CM | POA: Diagnosis not present

## 2022-03-23 DIAGNOSIS — Z0000 Encounter for general adult medical examination without abnormal findings: Secondary | ICD-10-CM | POA: Diagnosis not present

## 2022-03-23 DIAGNOSIS — M810 Age-related osteoporosis without current pathological fracture: Secondary | ICD-10-CM | POA: Diagnosis not present

## 2022-03-25 ENCOUNTER — Other Ambulatory Visit: Payer: Self-pay | Admitting: Family Medicine

## 2022-03-25 DIAGNOSIS — M810 Age-related osteoporosis without current pathological fracture: Secondary | ICD-10-CM

## 2022-03-25 DIAGNOSIS — H10501 Unspecified blepharoconjunctivitis, right eye: Secondary | ICD-10-CM | POA: Diagnosis not present

## 2022-03-25 DIAGNOSIS — H0100A Unspecified blepharitis right eye, upper and lower eyelids: Secondary | ICD-10-CM | POA: Diagnosis not present

## 2022-06-02 DIAGNOSIS — M542 Cervicalgia: Secondary | ICD-10-CM | POA: Diagnosis not present

## 2022-06-04 DIAGNOSIS — M542 Cervicalgia: Secondary | ICD-10-CM | POA: Diagnosis not present

## 2022-06-09 DIAGNOSIS — M542 Cervicalgia: Secondary | ICD-10-CM | POA: Diagnosis not present

## 2022-06-11 DIAGNOSIS — M542 Cervicalgia: Secondary | ICD-10-CM | POA: Diagnosis not present

## 2022-06-18 DIAGNOSIS — M542 Cervicalgia: Secondary | ICD-10-CM | POA: Diagnosis not present

## 2022-06-24 DIAGNOSIS — M542 Cervicalgia: Secondary | ICD-10-CM | POA: Diagnosis not present

## 2022-06-29 DIAGNOSIS — N952 Postmenopausal atrophic vaginitis: Secondary | ICD-10-CM | POA: Diagnosis not present

## 2022-07-21 DIAGNOSIS — H10503 Unspecified blepharoconjunctivitis, bilateral: Secondary | ICD-10-CM | POA: Diagnosis not present

## 2022-07-22 ENCOUNTER — Ambulatory Visit
Admission: RE | Admit: 2022-07-22 | Discharge: 2022-07-22 | Disposition: A | Discharge status: Home / Self Care | Payer: Medicare Other | Source: Ambulatory Visit | Attending: Family Medicine | Admitting: Family Medicine

## 2022-07-22 DIAGNOSIS — M8588 Other specified disorders of bone density and structure, other site: Secondary | ICD-10-CM | POA: Diagnosis not present

## 2022-07-22 DIAGNOSIS — M810 Age-related osteoporosis without current pathological fracture: Secondary | ICD-10-CM | POA: Diagnosis not present

## 2022-07-22 DIAGNOSIS — Z780 Asymptomatic menopausal state: Secondary | ICD-10-CM | POA: Diagnosis not present

## 2022-08-21 DIAGNOSIS — H0100B Unspecified blepharitis left eye, upper and lower eyelids: Secondary | ICD-10-CM | POA: Diagnosis not present

## 2022-08-21 DIAGNOSIS — H0100A Unspecified blepharitis right eye, upper and lower eyelids: Secondary | ICD-10-CM | POA: Diagnosis not present

## 2022-08-28 DIAGNOSIS — K5901 Slow transit constipation: Secondary | ICD-10-CM | POA: Diagnosis not present

## 2022-08-28 DIAGNOSIS — Z1211 Encounter for screening for malignant neoplasm of colon: Secondary | ICD-10-CM | POA: Diagnosis not present

## 2022-09-16 DIAGNOSIS — H2513 Age-related nuclear cataract, bilateral: Secondary | ICD-10-CM | POA: Diagnosis not present

## 2022-09-24 DIAGNOSIS — K5730 Diverticulosis of large intestine without perforation or abscess without bleeding: Secondary | ICD-10-CM | POA: Diagnosis not present

## 2022-09-24 DIAGNOSIS — Z1211 Encounter for screening for malignant neoplasm of colon: Secondary | ICD-10-CM | POA: Diagnosis not present

## 2022-09-24 DIAGNOSIS — K649 Unspecified hemorrhoids: Secondary | ICD-10-CM | POA: Diagnosis not present

## 2022-09-28 DIAGNOSIS — Z1231 Encounter for screening mammogram for malignant neoplasm of breast: Secondary | ICD-10-CM | POA: Diagnosis not present

## 2022-10-06 DIAGNOSIS — H2512 Age-related nuclear cataract, left eye: Secondary | ICD-10-CM | POA: Diagnosis not present

## 2022-10-06 DIAGNOSIS — Z961 Presence of intraocular lens: Secondary | ICD-10-CM | POA: Diagnosis not present

## 2022-10-06 DIAGNOSIS — H25811 Combined forms of age-related cataract, right eye: Secondary | ICD-10-CM | POA: Diagnosis not present

## 2022-10-27 DIAGNOSIS — Z961 Presence of intraocular lens: Secondary | ICD-10-CM | POA: Diagnosis not present

## 2022-10-27 DIAGNOSIS — H25811 Combined forms of age-related cataract, right eye: Secondary | ICD-10-CM | POA: Diagnosis not present

## 2022-10-27 DIAGNOSIS — H2511 Age-related nuclear cataract, right eye: Secondary | ICD-10-CM | POA: Diagnosis not present

## 2023-01-19 DIAGNOSIS — L720 Epidermal cyst: Secondary | ICD-10-CM | POA: Diagnosis not present

## 2023-02-25 DIAGNOSIS — M62838 Other muscle spasm: Secondary | ICD-10-CM | POA: Diagnosis not present

## 2023-02-25 DIAGNOSIS — J309 Allergic rhinitis, unspecified: Secondary | ICD-10-CM | POA: Diagnosis not present

## 2023-02-25 DIAGNOSIS — R519 Headache, unspecified: Secondary | ICD-10-CM | POA: Diagnosis not present

## 2023-03-03 DIAGNOSIS — M542 Cervicalgia: Secondary | ICD-10-CM | POA: Diagnosis not present

## 2023-03-08 DIAGNOSIS — M542 Cervicalgia: Secondary | ICD-10-CM | POA: Diagnosis not present

## 2023-03-11 DIAGNOSIS — M542 Cervicalgia: Secondary | ICD-10-CM | POA: Diagnosis not present

## 2023-03-15 DIAGNOSIS — M542 Cervicalgia: Secondary | ICD-10-CM | POA: Diagnosis not present

## 2023-03-17 DIAGNOSIS — M542 Cervicalgia: Secondary | ICD-10-CM | POA: Diagnosis not present

## 2023-03-29 DIAGNOSIS — Z13220 Encounter for screening for lipoid disorders: Secondary | ICD-10-CM | POA: Diagnosis not present

## 2023-03-29 DIAGNOSIS — Z131 Encounter for screening for diabetes mellitus: Secondary | ICD-10-CM | POA: Diagnosis not present

## 2023-03-29 DIAGNOSIS — J309 Allergic rhinitis, unspecified: Secondary | ICD-10-CM | POA: Diagnosis not present

## 2023-03-29 DIAGNOSIS — E559 Vitamin D deficiency, unspecified: Secondary | ICD-10-CM | POA: Diagnosis not present

## 2023-03-29 DIAGNOSIS — Z23 Encounter for immunization: Secondary | ICD-10-CM | POA: Diagnosis not present

## 2023-03-29 DIAGNOSIS — G44221 Chronic tension-type headache, intractable: Secondary | ICD-10-CM | POA: Diagnosis not present

## 2023-03-29 DIAGNOSIS — M810 Age-related osteoporosis without current pathological fracture: Secondary | ICD-10-CM | POA: Diagnosis not present

## 2023-03-29 DIAGNOSIS — Z1331 Encounter for screening for depression: Secondary | ICD-10-CM | POA: Diagnosis not present

## 2023-03-29 DIAGNOSIS — Z0000 Encounter for general adult medical examination without abnormal findings: Secondary | ICD-10-CM | POA: Diagnosis not present

## 2023-04-01 DIAGNOSIS — Z23 Encounter for immunization: Secondary | ICD-10-CM | POA: Diagnosis not present

## 2023-06-29 DIAGNOSIS — H10503 Unspecified blepharoconjunctivitis, bilateral: Secondary | ICD-10-CM | POA: Diagnosis not present

## 2023-07-05 DIAGNOSIS — R030 Elevated blood-pressure reading, without diagnosis of hypertension: Secondary | ICD-10-CM | POA: Diagnosis not present

## 2023-07-05 DIAGNOSIS — L720 Epidermal cyst: Secondary | ICD-10-CM | POA: Diagnosis not present

## 2023-07-05 DIAGNOSIS — N907 Vulvar cyst: Secondary | ICD-10-CM | POA: Diagnosis not present

## 2023-07-05 DIAGNOSIS — Z01419 Encounter for gynecological examination (general) (routine) without abnormal findings: Secondary | ICD-10-CM | POA: Diagnosis not present

## 2023-07-05 DIAGNOSIS — N958 Other specified menopausal and perimenopausal disorders: Secondary | ICD-10-CM | POA: Diagnosis not present

## 2023-07-06 DIAGNOSIS — L720 Epidermal cyst: Secondary | ICD-10-CM | POA: Diagnosis not present

## 2023-07-06 DIAGNOSIS — L723 Sebaceous cyst: Secondary | ICD-10-CM | POA: Diagnosis not present

## 2023-07-21 DIAGNOSIS — R030 Elevated blood-pressure reading, without diagnosis of hypertension: Secondary | ICD-10-CM | POA: Diagnosis not present

## 2023-07-26 DIAGNOSIS — M542 Cervicalgia: Secondary | ICD-10-CM | POA: Diagnosis not present

## 2023-07-29 DIAGNOSIS — M542 Cervicalgia: Secondary | ICD-10-CM | POA: Diagnosis not present

## 2023-08-17 DIAGNOSIS — M542 Cervicalgia: Secondary | ICD-10-CM | POA: Diagnosis not present

## 2023-09-20 DIAGNOSIS — K5901 Slow transit constipation: Secondary | ICD-10-CM | POA: Diagnosis not present

## 2023-09-20 DIAGNOSIS — K649 Unspecified hemorrhoids: Secondary | ICD-10-CM | POA: Diagnosis not present

## 2023-09-29 DIAGNOSIS — Z1231 Encounter for screening mammogram for malignant neoplasm of breast: Secondary | ICD-10-CM | POA: Diagnosis not present

## 2023-09-30 DIAGNOSIS — H01115 Allergic dermatitis of left lower eyelid: Secondary | ICD-10-CM | POA: Diagnosis not present

## 2023-10-09 DIAGNOSIS — M810 Age-related osteoporosis without current pathological fracture: Secondary | ICD-10-CM | POA: Diagnosis not present

## 2023-10-11 DIAGNOSIS — M542 Cervicalgia: Secondary | ICD-10-CM | POA: Diagnosis not present

## 2023-10-14 DIAGNOSIS — M542 Cervicalgia: Secondary | ICD-10-CM | POA: Diagnosis not present

## 2023-10-18 DIAGNOSIS — M542 Cervicalgia: Secondary | ICD-10-CM | POA: Diagnosis not present

## 2023-10-20 DIAGNOSIS — M542 Cervicalgia: Secondary | ICD-10-CM | POA: Diagnosis not present

## 2023-10-25 DIAGNOSIS — W57XXXA Bitten or stung by nonvenomous insect and other nonvenomous arthropods, initial encounter: Secondary | ICD-10-CM | POA: Diagnosis not present

## 2023-10-25 DIAGNOSIS — S40862A Insect bite (nonvenomous) of left upper arm, initial encounter: Secondary | ICD-10-CM | POA: Diagnosis not present

## 2023-11-08 DIAGNOSIS — M810 Age-related osteoporosis without current pathological fracture: Secondary | ICD-10-CM | POA: Diagnosis not present

## 2023-12-09 DIAGNOSIS — M810 Age-related osteoporosis without current pathological fracture: Secondary | ICD-10-CM | POA: Diagnosis not present

## 2024-01-09 DIAGNOSIS — M810 Age-related osteoporosis without current pathological fracture: Secondary | ICD-10-CM | POA: Diagnosis not present

## 2024-01-17 DIAGNOSIS — M542 Cervicalgia: Secondary | ICD-10-CM | POA: Diagnosis not present

## 2024-01-21 DIAGNOSIS — M542 Cervicalgia: Secondary | ICD-10-CM | POA: Diagnosis not present

## 2024-01-24 DIAGNOSIS — M542 Cervicalgia: Secondary | ICD-10-CM | POA: Diagnosis not present

## 2024-01-27 DIAGNOSIS — M542 Cervicalgia: Secondary | ICD-10-CM | POA: Diagnosis not present

## 2024-02-03 DIAGNOSIS — M542 Cervicalgia: Secondary | ICD-10-CM | POA: Diagnosis not present

## 2024-02-10 DIAGNOSIS — M542 Cervicalgia: Secondary | ICD-10-CM | POA: Diagnosis not present

## 2024-02-16 DIAGNOSIS — H02831 Dermatochalasis of right upper eyelid: Secondary | ICD-10-CM | POA: Diagnosis not present

## 2024-02-16 DIAGNOSIS — H52203 Unspecified astigmatism, bilateral: Secondary | ICD-10-CM | POA: Diagnosis not present

## 2024-02-16 DIAGNOSIS — H02834 Dermatochalasis of left upper eyelid: Secondary | ICD-10-CM | POA: Diagnosis not present

## 2024-03-01 DIAGNOSIS — G43909 Migraine, unspecified, not intractable, without status migrainosus: Secondary | ICD-10-CM | POA: Diagnosis not present

## 2024-03-01 DIAGNOSIS — Z23 Encounter for immunization: Secondary | ICD-10-CM | POA: Diagnosis not present

## 2024-03-01 DIAGNOSIS — G44221 Chronic tension-type headache, intractable: Secondary | ICD-10-CM | POA: Diagnosis not present

## 2024-03-01 DIAGNOSIS — J309 Allergic rhinitis, unspecified: Secondary | ICD-10-CM | POA: Diagnosis not present

## 2024-03-01 DIAGNOSIS — M542 Cervicalgia: Secondary | ICD-10-CM | POA: Diagnosis not present

## 2024-03-08 ENCOUNTER — Other Ambulatory Visit (HOSPITAL_BASED_OUTPATIENT_CLINIC_OR_DEPARTMENT_OTHER): Payer: Self-pay

## 2024-03-08 DIAGNOSIS — Z23 Encounter for immunization: Secondary | ICD-10-CM | POA: Diagnosis not present

## 2024-03-08 MED ORDER — COMIRNATY 30 MCG/0.3ML IM SUSY
0.3000 mL | PREFILLED_SYRINGE | Freq: Once | INTRAMUSCULAR | 0 refills | Status: AC
  Filled 2024-03-08: qty 0.3, 1d supply, fill #0

## 2024-03-17 DIAGNOSIS — G43909 Migraine, unspecified, not intractable, without status migrainosus: Secondary | ICD-10-CM | POA: Diagnosis not present

## 2024-03-17 DIAGNOSIS — J309 Allergic rhinitis, unspecified: Secondary | ICD-10-CM | POA: Diagnosis not present

## 2024-03-17 DIAGNOSIS — J029 Acute pharyngitis, unspecified: Secondary | ICD-10-CM | POA: Diagnosis not present

## 2024-03-21 DIAGNOSIS — M542 Cervicalgia: Secondary | ICD-10-CM | POA: Diagnosis not present

## 2024-03-23 DIAGNOSIS — M542 Cervicalgia: Secondary | ICD-10-CM | POA: Diagnosis not present

## 2024-03-29 DIAGNOSIS — M810 Age-related osteoporosis without current pathological fracture: Secondary | ICD-10-CM | POA: Diagnosis not present

## 2024-03-29 DIAGNOSIS — G43909 Migraine, unspecified, not intractable, without status migrainosus: Secondary | ICD-10-CM | POA: Diagnosis not present

## 2024-03-29 DIAGNOSIS — Z1331 Encounter for screening for depression: Secondary | ICD-10-CM | POA: Diagnosis not present

## 2024-03-29 DIAGNOSIS — E559 Vitamin D deficiency, unspecified: Secondary | ICD-10-CM | POA: Diagnosis not present

## 2024-03-29 DIAGNOSIS — Z0000 Encounter for general adult medical examination without abnormal findings: Secondary | ICD-10-CM | POA: Diagnosis not present

## 2024-03-29 DIAGNOSIS — J309 Allergic rhinitis, unspecified: Secondary | ICD-10-CM | POA: Diagnosis not present

## 2024-03-29 DIAGNOSIS — G44221 Chronic tension-type headache, intractable: Secondary | ICD-10-CM | POA: Diagnosis not present

## 2024-03-30 DIAGNOSIS — M542 Cervicalgia: Secondary | ICD-10-CM | POA: Diagnosis not present

## 2024-04-02 ENCOUNTER — Other Ambulatory Visit (HOSPITAL_BASED_OUTPATIENT_CLINIC_OR_DEPARTMENT_OTHER): Payer: Self-pay | Admitting: Family Medicine

## 2024-04-02 DIAGNOSIS — M810 Age-related osteoporosis without current pathological fracture: Secondary | ICD-10-CM

## 2024-04-13 DIAGNOSIS — R110 Nausea: Secondary | ICD-10-CM | POA: Diagnosis not present

## 2024-04-13 DIAGNOSIS — F419 Anxiety disorder, unspecified: Secondary | ICD-10-CM | POA: Diagnosis not present
